# Patient Record
Sex: Female | Born: 1965 | Race: White | Hispanic: No | Marital: Single | Smoking: Never smoker
Health system: Southern US, Community
[De-identification: ages and names within clinical notes are randomized; demographics above are authoritative.]

## PROBLEM LIST (undated history)

## (undated) HISTORY — PX: ABDOMINAL HYSTERECTOMY: SHX81

## (undated) HISTORY — PX: TONSILLECTOMY: SUR1361

## (undated) HISTORY — PX: ELBOW SURGERY: SHX618

---

## 2016-07-05 ENCOUNTER — Encounter (HOSPITAL_COMMUNITY): Payer: Self-pay

## 2016-07-05 ENCOUNTER — Emergency Department (HOSPITAL_COMMUNITY): Payer: BLUE CROSS/BLUE SHIELD

## 2016-07-05 ENCOUNTER — Emergency Department (HOSPITAL_COMMUNITY)
Admission: EM | Admit: 2016-07-05 | Discharge: 2016-07-05 | Disposition: A | Discharge status: Home / Self Care | Payer: BLUE CROSS/BLUE SHIELD | Attending: Emergency Medicine | Admitting: Emergency Medicine

## 2016-07-05 DIAGNOSIS — Y9389 Activity, other specified: Secondary | ICD-10-CM | POA: Insufficient documentation

## 2016-07-05 DIAGNOSIS — Y999 Unspecified external cause status: Secondary | ICD-10-CM | POA: Diagnosis not present

## 2016-07-05 DIAGNOSIS — W540XXA Bitten by dog, initial encounter: Secondary | ICD-10-CM | POA: Insufficient documentation

## 2016-07-05 DIAGNOSIS — Z23 Encounter for immunization: Secondary | ICD-10-CM | POA: Diagnosis not present

## 2016-07-05 DIAGNOSIS — Y9289 Other specified places as the place of occurrence of the external cause: Secondary | ICD-10-CM | POA: Diagnosis not present

## 2016-07-05 DIAGNOSIS — S51851A Open bite of right forearm, initial encounter: Secondary | ICD-10-CM | POA: Diagnosis present

## 2016-07-05 MED ORDER — HYDROCODONE-ACETAMINOPHEN 5-325 MG PO TABS: 1.0000 | ORAL_TABLET | Freq: Four times a day (QID) | ORAL | 0 refills | Status: DC | PRN

## 2016-07-05 MED ORDER — AMOXICILLIN-POT CLAVULANATE 875-125 MG PO TABS
1.0000 | ORAL_TABLET | Freq: Once | ORAL | Status: AC
  Administered 2016-07-05: 1 via ORAL
  Filled 2016-07-05: qty 1

## 2016-07-05 MED ORDER — AMOXICILLIN-POT CLAVULANATE 875-125 MG PO TABS: 1.0000 | ORAL_TABLET | Freq: Two times a day (BID) | ORAL | 0 refills | Status: DC

## 2016-07-05 MED ORDER — TETANUS-DIPHTH-ACELL PERTUSSIS 5-2.5-18.5 LF-MCG/0.5 IM SUSP
0.5000 mL | Freq: Once | INTRAMUSCULAR | Status: AC
  Administered 2016-07-05: 0.5 mL via INTRAMUSCULAR
  Filled 2016-07-05: qty 0.5

## 2016-07-05 MED ORDER — OXYCODONE-ACETAMINOPHEN 5-325 MG PO TABS
1.0000 | ORAL_TABLET | Freq: Once | ORAL | Status: AC
  Administered 2016-07-05: 1 via ORAL
  Filled 2016-07-05: qty 1

## 2016-07-05 NOTE — ED Provider Notes (Signed)
[facility]-EMERGENCY DEPT Provider Note   CSN: [id number] Arrival date & time: 07/05/16  2106  By signing my name below, I, Cynda AcresHailei Fulton, attest that this documentation has been prepared under the direction and in the presence of Fayrene HelperBowie Pankaj Haack.  Electronically Signed: Cynda AcresHailei Fulton, Scribe. 07/05/16. 10:02 PM.  History   Chief Complaint No chief complaint on file.  HPI Comments: Hannah Harris is a 51 y.o. female with no apparent medical history, who presents to the Emergency Department complaining of a sudden-onset bite to the right lower forearm that happened earlier today. Patient states she kicked a dog treat and her spouses dog bit her on the right arm. The dog breed was german shepard. Patient was not intentionally attacked. Unsure whether the dog is up to date on it's rabies shots. Patient has associated pain to the right forearm. Patient describes her pain as sharp with a severity of 10/10. Patient reports pouring hydrogen peroxide on the arm with no improvement. Patient denies any other symptoms. She is not UTD with tetanus.  She is R hand dominant.  Does report of shooting pain down to her 4-5th fingers and up to her elbow when she moves her R wrist.  No numbness.    The history is provided by the patient. No language interpreter was used.    No past medical history on file.  There are no active problems to display for this patient.   No past surgical history on file.  OB History    No data available       Home Medications    Prior to Admission medications   Not on File    Family History No family history on file.  Social History Social History  Substance Use Topics  . Smoking status: Not on file  . Smokeless tobacco: Not on file  . Alcohol use Not on file     Allergies   Patient has no allergy information on record.   Review of Systems Review of Systems  Constitutional: Negative for chills and fever.  Gastrointestinal: Negative for nausea and vomiting.    Skin: Positive for color change.       Bite to the right forearm.     Physical Exam Updated Vital Signs BP 146/88 (BP Location: Left Arm)   Pulse 88   Temp 97.8 F (36.6 C) (Oral)   Resp 20   Wt 110 lb (49.9 kg)   SpO2 98%   Physical Exam  Constitutional: She is oriented to person, place, and time. She appears well-developed.  HENT:  Head: Normocephalic and atraumatic.  Mouth/Throat: Oropharynx is clear and moist.  Eyes: Conjunctivae and EOM are normal. Pupils are equal, round, and reactive to light.  Neck: Normal range of motion. Neck supple.  Pulmonary/Chest: Effort normal.  Musculoskeletal: Normal range of motion. She exhibits tenderness.  R Wrist: able to perform flexion and extension of supination and pronation. Sensation intact through all fingers with normal strength.  Tenderness to wrist at the bite site.  6 small puncture wound noted mostly to the volar aspect.  Neurological: She is alert and oriented to person, place, and time.  Skin: Skin is warm and dry.  Bite mark noted to the volar aspects of the forearm without active bleeding or foreign object. Erythema around the puncture wound area of the right wrist with normal sensation.   Psychiatric: She has a normal mood and affect.  Nursing note and vitals reviewed.    ED Treatments / Results  DIAGNOSTIC STUDIES: Oxygen  Saturation is 98% on RA, normal by my interpretation.    COORDINATION OF CARE: 10:02 PM Discussed treatment plan with pt at bedside and pt agreed to plan, which includes an x-ray.   dLabs (all labs ordered are listed, but only abnormal results are displayed) Labs Reviewed - No data to display  EKG  EKG Interpretation None       Radiology Dg Forearm Right  Result Date: 07/05/2016 CLINICAL DATA:  Dog bite with pain and numbness. EXAM: RIGHT FOREARM - 2 VIEW COMPARISON:  None. FINDINGS: There is no evidence of fracture or other focal bone lesions. Wrist and elbow alignment is maintained.  Edema in the subcutaneous tissues about the distal forearm, no radiopaque foreign body. IMPRESSION: Soft tissue edema about the distal forearm.  No osseous abnormality. Electronically Signed   By: Rubye Oaks M.D.   On: 07/05/2016 22:28    Procedures Procedures (including critical care time)  Medications Ordered in ED Medications  Tdap (BOOSTRIX) injection 0.5 mL (0.5 mLs Intramuscular Given 07/05/16 2229)  oxyCODONE-acetaminophen (PERCOCET/ROXICET) 5-325 MG per tablet 1 tablet (1 tablet Oral Given 07/05/16 2229)  amoxicillin-clavulanate (AUGMENTIN) 875-125 MG per tablet 1 tablet (1 tablet Oral Given 07/05/16 2229)     Initial Impression / Assessment and Plan / ED Course  I have reviewed the triage vital signs and the nursing notes.  Pertinent labs & imaging results that were available during my care of the patient were reviewed by me and considered in my medical decision making (see chart for details).     BP 146/88 (BP Location: Left Arm)   Pulse 88   Temp 97.8 F (36.6 C) (Oral)   Resp 20   Wt 49.9 kg   SpO2 98%    Final Clinical Impressions(s) / ED Diagnoses   Final diagnoses:  Dog bite of right forearm, initial encounter    New Prescriptions New Prescriptions   AMOXICILLIN-CLAVULANATE (AUGMENTIN) 875-125 MG TABLET    Take 1 tablet by mouth 2 (two) times daily. One po bid x 7 days   HYDROCODONE-ACETAMINOPHEN (NORCO/VICODIN) 5-325 MG TABLET    Take 1 tablet by mouth every 6 (six) hours as needed for moderate pain or severe pain.   I personally performed the services described in this documentation, which was scribed in my presence. The recorded information has been reviewed and is accurate.     10:37 PM Pt had a provoked bite to R distal forearm/wrist from her girlfriend's dog.  Although they are not sure of the rabies status, the dog is being monitored that pt refused rabies prophylactic treatment.  Bite wound were irrigated and dressed appropriately.  ACE wrap  applied.  Xray of R forearm without acute changes.  Pt is NVI.  However, due to location of injury, hand referral given.  Will treat with pain medication and augmentin.  tdap update.  Strict return precaution given.  Pt voice understanding and agrees with plan.  Strict return precaution given.   In order to decrease risk of narcotic abuse. Pt's record were checked using the [place] Controlled Substance database.     Fayrene Helper, PA-C 07/05/16 1610    Gwyneth Sprout, MD 07/06/16 [phone]

## 2016-07-05 NOTE — ED Triage Notes (Signed)
Pt reports being bit by her girlfriend's dog this evening. The bite is on her R forearm. Bleeding controlled. She reports pain radiating to her elbow. Pt nor the dog owner are sure about dog's rabies vaccination status. A&Ox4. Ambulatory.

## 2016-07-07 ENCOUNTER — Emergency Department (HOSPITAL_COMMUNITY)
Admission: EM | Admit: 2016-07-07 | Discharge: 2016-07-07 | Disposition: A | Discharge status: Home / Self Care | Payer: BLUE CROSS/BLUE SHIELD | Attending: Emergency Medicine | Admitting: Emergency Medicine

## 2016-07-07 ENCOUNTER — Encounter (HOSPITAL_COMMUNITY): Payer: Self-pay | Admitting: *Deleted

## 2016-07-07 DIAGNOSIS — L03113 Cellulitis of right upper limb: Secondary | ICD-10-CM | POA: Diagnosis not present

## 2016-07-07 LAB — CBC
HEMATOCRIT: 38.6 % (ref 36.0–46.0)
Hemoglobin: 13 g/dL (ref 12.0–15.0)
MCH: 30.9 pg (ref 26.0–34.0)
MCHC: 33.7 g/dL (ref 30.0–36.0)
MCV: 91.7 fL (ref 78.0–100.0)
Platelets: 180 10*3/uL (ref 150–400)
RBC: 4.21 MIL/uL (ref 3.87–5.11)
RDW: 12.8 % (ref 11.5–15.5)
WBC: 7.5 10*3/uL (ref 4.0–10.5)

## 2016-07-07 LAB — BASIC METABOLIC PANEL
ANION GAP: 8 (ref 5–15)
BUN: 12 mg/dL (ref 6–20)
CHLORIDE: 104 mmol/L (ref 101–111)
CO2: 28 mmol/L (ref 22–32)
Calcium: 9.4 mg/dL (ref 8.9–10.3)
Creatinine, Ser: 0.7 mg/dL (ref 0.44–1.00)
GFR calc Af Amer: >60 mL/min (ref >60)
Glucose, Bld: 106 mg/dL — ABNORMAL HIGH (ref 65–99)
POTASSIUM: 3.5 mmol/L (ref 3.5–5.1)
Sodium: 140 mmol/L (ref 135–145)

## 2016-07-07 MED ORDER — SODIUM CHLORIDE 0.9 % IV SOLN
3.0000 g | Freq: Once | INTRAVENOUS | Status: AC
  Administered 2016-07-07: 3 g via INTRAVENOUS
  Filled 2016-07-07: qty 3

## 2016-07-07 MED ORDER — FENTANYL CITRATE (PF) 100 MCG/2ML IJ SOLN
50.0000 ug | Freq: Once | INTRAMUSCULAR | Status: AC
  Administered 2016-07-07: 50 ug via INTRAVENOUS
  Filled 2016-07-07: qty 2

## 2016-07-07 MED ORDER — TRAMADOL HCL 50 MG PO TABS: 50.0000 mg | ORAL_TABLET | Freq: Four times a day (QID) | ORAL | 0 refills | Status: DC | PRN

## 2016-07-07 MED ORDER — ACETAMINOPHEN 325 MG PO TABS
650.0000 mg | ORAL_TABLET | Freq: Once | ORAL | Status: AC
  Administered 2016-07-07: 650 mg via ORAL
  Filled 2016-07-07: qty 2

## 2016-07-07 NOTE — ED Provider Notes (Signed)
[facility]ER[facility] DEPT Provider Note   CSN: [id number] Arrival date & time: 07/07/16  0927     History   Chief Complaint Chief Complaint  Patient presents with  . dog bite follow up    HPI Hannah Harris is a 51 y.o. female.  HPI Patient presents to the emergency room for evaluation of pain in her arm associated with a dog bite.   The patient was bitten on her right forearm on March 1.   It was her family's dog.   Patient was evaluated in the emergency room. She was treated with tetanus and started on antibiotics. She was offered rabies prophylaxis but they do not feel that RABIES and she declined vaccination. Patient states her pain has been worsening. She has persistent pain in her right forearm. She was up all last night because of the persistent pain.  Patient denies any fevers or chills. She denies any purulent drainage.   History reviewed. No pertinent past medical history.  There are no active problems to display for this patient.   Past Surgical History:  Procedure Laterality Date  . ABDOMINAL HYSTERECTOMY    . ELBOW SURGERY    . TONSILLECTOMY      OB History    No data available       Home Medications    Prior to Admission medications   Medication Sig Start Date End Date Taking? Authorizing Provider  amoxicillin-clavulanate (AUGMENTIN) 875-125 MG tablet Take 1 tablet by mouth 2 (two) times daily. One po bid x 7 days 07/05/16   Fayrene HelperBowie Tran, PA-C  HYDROcodone-acetaminophen (NORCO/VICODIN) 5-325 MG tablet Take 1 tablet by mouth every 6 (six) hours as needed for moderate pain or severe pain. 07/05/16   Fayrene HelperBowie Tran, PA-C  traMADol (ULTRAM) 50 MG tablet Take 1 tablet (50 mg total) by mouth every 6 (six) hours as needed. 07/07/16   Linwood DibblesJon Ashdon Gillson, MD    Family History No family history on file.  Social History Social History  Substance Use Topics  . Smoking status: Never Smoker  . Smokeless tobacco: Never Used  . Alcohol use No     Allergies   Aspirin; Bee venom;  Dilaudid [hydromorphone hcl]; Doxycycline; and Sulfa antibiotics   Review of Systems Review of Systems  All other systems reviewed and are negative.    Physical Exam Updated Vital Signs BP 133/84 (BP Location: Left Arm)   Pulse 92   Temp 97.6 F (36.4 C)   Resp 16   Ht 5\' 4"  (1.626 m)   Wt 49.9 kg   SpO2 100%   BMI 18.88 kg/m   Physical Exam  Constitutional: She appears well-developed and well-nourished. No distress.  HENT:  Head: Normocephalic and atraumatic.  Right Ear: External ear normal.  Left Ear: External ear normal.  Eyes: Conjunctivae are normal. Right eye exhibits no discharge. Left eye exhibits no discharge. No scleral icterus.  Neck: Neck supple. No tracheal deviation present.  Cardiovascular: Normal rate.   Pulmonary/Chest: Effort normal. No stridor. No respiratory distress.  Abdominal: She exhibits no distension.  Musculoskeletal: She exhibits edema and tenderness.  Healing bite wounds on the right forearm, no purulent drainage, erythema and tenderness around the wounds, erythema and tenderness involving the right hand, no lymphangitic streaking towards the elbow, no fluctuance  Neurological: She is alert. Cranial nerve deficit: no gross deficits.  Skin: Skin is warm and dry. No rash noted.  Psychiatric: She has a normal mood and affect.  Nursing note and vitals reviewed.  ED Treatments / Results  Labs (all labs ordered are listed, but only abnormal results are displayed) Labs Reviewed  BASIC METABOLIC PANEL - Abnormal; Notable for the following:       Result Value   Glucose, Bld 106 (*)    All other components within normal limits  CBC     Radiology Dg Forearm Right  Result Date: 07/05/2016 CLINICAL DATA:  Dog bite with pain and numbness. EXAM: RIGHT FOREARM - 2 VIEW COMPARISON:  None. FINDINGS: There is no evidence of fracture or other focal bone lesions. Wrist and elbow alignment is maintained. Edema in the subcutaneous tissues about the  distal forearm, no radiopaque foreign body. IMPRESSION: Soft tissue edema about the distal forearm.  No osseous abnormality. Electronically Signed   By: Rubye Oaks M.D.   On: 07/05/2016 22:28    Procedures Procedures (including critical care time)  Medications Ordered in ED Medications  Ampicillin-Sulbactam (UNASYN) 3 g in sodium chloride 0.9 % 100 mL IVPB (0 g Intravenous Stopped 07/07/16 1128)  acetaminophen (TYLENOL) tablet 650 mg (650 mg Oral Given 07/07/16 1019)  fentaNYL (SUBLIMAZE) injection 50 mcg (50 mcg Intravenous Given 07/07/16 1028)     Initial Impression / Assessment and Plan / ED Course  I have reviewed the triage vital signs and the nursing notes.  Pertinent labs & imaging results that were available during my care of the patient were reviewed by me and considered in my medical decision making (see chart for details).  Clinical Course as of Jul 07 1133  Sat Jul 07, 2016  0944 Pt states dilaudid, and aspiring make her get hives.   Will give tylenol po  [JK]    Clinical Course User Index [JK] Linwood Dibbles, MD  1 rx for opiates noted on 3/1 in   Patient's laboratory tests are normal. She does not have a fever here. I do not see any signs of lymphangitic streaking. The patient is allergic to NSAIDs so she cannot take ibuprofen or Naprosyn. I do not think she needs to be hospitalized for cellulitis. I suspect her pain is related to the dog bite and the cellulitis.  She wanted to follow up with primary care doctor next week to make sure the symptoms are improving. Monitor for signs of increasing redness, fevers  Final Clinical Impressions(s) / ED Diagnoses   Final diagnoses:  Cellulitis of right upper extremity    New Prescriptions New Prescriptions   TRAMADOL (ULTRAM) 50 MG TABLET    Take 1 tablet (50 mg total) by mouth every 6 (six) hours as needed.     Linwood Dibbles, MD 07/07/16 1135

## 2016-07-07 NOTE — ED Triage Notes (Signed)
Pt bitten by dog 2 days ago, has redness, increased pain and swelling in area

## 2016-07-07 NOTE — Discharge Instructions (Signed)
Follow-up with a primary care doctor next week to make sure symptoms are improving, return to the emergency room for fevers worsening redness

## 2016-09-07 ENCOUNTER — Emergency Department (HOSPITAL_COMMUNITY)
Admission: EM | Admit: 2016-09-07 | Discharge: 2016-09-07 | Discharge status: Against Medical Advice | Payer: BLUE CROSS/BLUE SHIELD

## 2016-09-24 ENCOUNTER — Encounter (HOSPITAL_COMMUNITY): Payer: Self-pay

## 2016-09-24 ENCOUNTER — Emergency Department (HOSPITAL_COMMUNITY): Payer: BLUE CROSS/BLUE SHIELD

## 2016-09-24 ENCOUNTER — Emergency Department (HOSPITAL_COMMUNITY)
Admission: EM | Admit: 2016-09-24 | Discharge: 2016-09-24 | Disposition: A | Discharge status: Home / Self Care | Payer: BLUE CROSS/BLUE SHIELD | Attending: Emergency Medicine | Admitting: Emergency Medicine

## 2016-09-24 DIAGNOSIS — Y939 Activity, unspecified: Secondary | ICD-10-CM | POA: Diagnosis not present

## 2016-09-24 DIAGNOSIS — Y999 Unspecified external cause status: Secondary | ICD-10-CM | POA: Diagnosis not present

## 2016-09-24 DIAGNOSIS — S60212A Contusion of left wrist, initial encounter: Secondary | ICD-10-CM | POA: Insufficient documentation

## 2016-09-24 DIAGNOSIS — Y92512 Supermarket, store or market as the place of occurrence of the external cause: Secondary | ICD-10-CM | POA: Diagnosis not present

## 2016-09-24 DIAGNOSIS — S6992XA Unspecified injury of left wrist, hand and finger(s), initial encounter: Secondary | ICD-10-CM | POA: Diagnosis present

## 2016-09-24 DIAGNOSIS — W228XXA Striking against or struck by other objects, initial encounter: Secondary | ICD-10-CM | POA: Insufficient documentation

## 2016-09-24 DIAGNOSIS — Z79899 Other long term (current) drug therapy: Secondary | ICD-10-CM | POA: Insufficient documentation

## 2016-09-24 MED ORDER — IBUPROFEN 600 MG PO TABS: 600.0000 mg | ORAL_TABLET | Freq: Four times a day (QID) | ORAL | 0 refills | Status: DC | PRN

## 2016-09-24 MED ORDER — LORAZEPAM 1 MG PO TABS
1.0000 mg | ORAL_TABLET | Freq: Once | ORAL | Status: AC
  Administered 2016-09-24: 1 mg via ORAL
  Filled 2016-09-24: qty 1

## 2016-09-24 MED ORDER — IBUPROFEN 200 MG PO TABS
600.0000 mg | ORAL_TABLET | Freq: Once | ORAL | Status: AC
  Administered 2016-09-24: 600 mg via ORAL
  Filled 2016-09-24: qty 3

## 2016-09-24 NOTE — ED Notes (Signed)
PT DISCHARGED. INSTRUCTIONS AND PRESCRIPTION GIVEN. AAOX4. PT IN NO APPARENT DISTRESS WITH MODERATE PAIN. THE OPPORTUNITY TO ASK QUESTIONS WAS PROVIDED. 
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
## 2016-09-24 NOTE — ED Notes (Signed)
ED Provider at bedside. 

## 2016-09-24 NOTE — Discharge Instructions (Signed)
Take your medication as prescribed. I also recommend resting, elevating and applying ice to her left hand/wrist for 15-20 minutes 3-4 times daily. You may continue wearing your Ace wrap as needed for comfort over the next few days. If your symptoms are not improved over the next week I recommend following up with the orthopedic clinic listed below.
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
## 2016-09-28 ENCOUNTER — Emergency Department (HOSPITAL_COMMUNITY)
Admission: EM | Admit: 2016-09-28 | Discharge: 2016-09-29 | Disposition: A | Discharge status: Home / Self Care | Payer: BLUE CROSS/BLUE SHIELD | Attending: Emergency Medicine | Admitting: Emergency Medicine

## 2016-09-28 ENCOUNTER — Encounter (HOSPITAL_COMMUNITY): Payer: Self-pay

## 2016-09-28 DIAGNOSIS — Y92009 Unspecified place in unspecified non-institutional (private) residence as the place of occurrence of the external cause: Secondary | ICD-10-CM

## 2016-09-28 DIAGNOSIS — Y999 Unspecified external cause status: Secondary | ICD-10-CM | POA: Diagnosis not present

## 2016-09-28 DIAGNOSIS — S0003XA Contusion of scalp, initial encounter: Secondary | ICD-10-CM | POA: Diagnosis not present

## 2016-09-28 DIAGNOSIS — W19XXXA Unspecified fall, initial encounter: Secondary | ICD-10-CM

## 2016-09-28 DIAGNOSIS — S93401A Sprain of unspecified ligament of right ankle, initial encounter: Secondary | ICD-10-CM | POA: Diagnosis not present

## 2016-09-28 DIAGNOSIS — S199XXA Unspecified injury of neck, initial encounter: Secondary | ICD-10-CM | POA: Diagnosis present

## 2016-09-28 DIAGNOSIS — Y939 Activity, unspecified: Secondary | ICD-10-CM | POA: Diagnosis not present

## 2016-09-28 DIAGNOSIS — S5001XA Contusion of right elbow, initial encounter: Secondary | ICD-10-CM | POA: Diagnosis not present

## 2016-09-28 DIAGNOSIS — Y929 Unspecified place or not applicable: Secondary | ICD-10-CM | POA: Diagnosis not present

## 2016-09-28 DIAGNOSIS — S161XXA Strain of muscle, fascia and tendon at neck level, initial encounter: Secondary | ICD-10-CM | POA: Diagnosis not present

## 2016-09-28 DIAGNOSIS — W08XXXA Fall from other furniture, initial encounter: Secondary | ICD-10-CM | POA: Insufficient documentation

## 2016-09-28 MED ORDER — NAPROXEN 500 MG PO TABS
500.0000 mg | ORAL_TABLET | Freq: Once | ORAL | Status: AC
  Administered 2016-09-29: 500 mg via ORAL
  Filled 2016-09-28: qty 1

## 2016-09-28 MED ORDER — HYDROCODONE-ACETAMINOPHEN 5-325 MG PO TABS
1.0000 | ORAL_TABLET | Freq: Once | ORAL | Status: AC
  Administered 2016-09-29: 1 via ORAL
  Filled 2016-09-28: qty 1

## 2016-09-28 NOTE — ED Provider Notes (Signed)
[facility]-EMERGENCY DEPT Provider Note   CSN: [id number] Arrival date & time: 09/28/16  2137  By signing my name below, I, Hannah Harris, attest that this documentation has been prepared under the direction and in the presence of TRW AutomotiveKelly Deysi Soldo, [place]PA-C. Electronically Signed: Teofilo PodMatthew P. Harris, ED Scribe. 09/29/2016. 4:38 AM.   History   Chief Complaint Chief Complaint  Patient presents with  . Fall  . Head Injury    The history is provided by the patient. No language interpreter was used.   HPI Comments:  Hannah Harris is a 51 y.o. female who presents to the Emergency Department s/p fall that occurred earlier today. Pt reports that she fell off of a stool today, landing on her right side, and complains of pain to her right arm, side, and neck. She endorses hitting the back of her head, but denies LOC. Pt has a c-collar in place. No alleviating factors noted.  Denies nausea, vomiting. She does report drinking a few beers prior to her fall.  History reviewed. No pertinent past medical history.  There are no active problems to display for this patient.   Past Surgical History:  Procedure Laterality Date  . ABDOMINAL HYSTERECTOMY    . ELBOW SURGERY    . TONSILLECTOMY      OB History    No data available       Home Medications    Prior to Admission medications   Medication Sig Start Date End Date Taking? Authorizing Provider  amoxicillin-clavulanate (AUGMENTIN) 875-125 MG tablet Take 1 tablet by mouth 2 (two) times daily. One po bid x 7 days Patient not taking: Reported on 09/29/2016 07/05/16   Hannah Harris, [place], PA-C  HYDROcodone-acetaminophen (NORCO/VICODIN) 5-325 MG tablet Take 1 tablet by mouth every 6 (six) hours as needed for moderate pain or severe pain. Patient not taking: Reported on 09/29/2016 07/05/16   Hannah Harris, [place], PA-C  ibuprofen (ADVIL,MOTRIN) 600 MG tablet Take 1 tablet (600 mg total) by mouth every 6 (six) hours as needed. 09/29/16   Hannah Harris, Rc Amison, PA-C  traMADol (ULTRAM)  50 MG tablet Take 1 tablet (50 mg total) by mouth every 6 (six) hours as needed. 09/29/16   Hannah Harris, Hannah Denz, PA-C    Family History History reviewed. No pertinent family history.  Social History Social History  Substance Use Topics  . Smoking status: Never Smoker  . Smokeless tobacco: Never Used  . Alcohol use No     Allergies   Bee venom; Dilaudid [hydromorphone hcl]; Aspirin; Doxycycline; and Sulfa antibiotics   Review of Systems Review of Systems All systems reviewed and are negative for acute change except as noted in the HPI.   Physical Exam Updated Vital Signs BP 100/66 (BP Location: Left Arm)   Pulse 66   Temp 97.5 F (36.4 C) (Oral)   Resp 18   SpO2 94%   Physical Exam  Constitutional: She is oriented to person, place, and time. She appears well-developed and well-nourished. No distress.  Nontoxic and in NAD  HENT:  Head: Normocephalic and atraumatic.  No Battle sign or raccoons eyes. No skull instability or palpable hematoma  Eyes: Conjunctivae and EOM are normal. No scleral icterus.  Neck:  Cervical collar in place  Cardiovascular: Normal rate, normal heart sounds and intact distal pulses.   DP and PT pulses 2+ in the right lower extremity.  Pulmonary/Chest: Effort normal. No respiratory distress. She has no wheezes.  Respirations even and unlabored  Abdominal: She exhibits no distension.  Musculoskeletal: Normal range of  motion.  Contusion noted to the right elbow. Normal range of motion of the right upper extremity. There is also bruising and swelling to the lateral aspect of the right ankle. No deformity noted.  Neurological: She is alert and oriented to person, place, and time. No cranial nerve deficit. She exhibits normal muscle tone. Coordination normal.  GCS 15. No focal neurologic deficits appreciated.  Skin: Skin is warm and dry. No rash noted. She is not diaphoretic. No erythema. No pallor.  Psychiatric: She has a normal mood and affect. Her  behavior is normal.  Nursing note and vitals reviewed.    ED Treatments / Results  DIAGNOSTIC STUDIES:  Oxygen Saturation is 100% on RA, normal by my interpretation.    COORDINATION OF CARE:  11:51 PM CT head and C-spine ordered. Unable to r/o C-spine via NEXUS criteria given hx of ETOH. Will also obtain Xrays. Discussed treatment plan with pt at bedside and pt agreed to plan.   Labs (all labs ordered are listed, but only abnormal results are displayed) Labs Reviewed - No data to display  EKG  EKG Interpretation None       Radiology Dg Elbow Complete Right  Result Date: 09/29/2016 CLINICAL DATA:  Acute onset of right posterior elbow pain and swelling, after fall from stool. Initial encounter. EXAM: RIGHT ELBOW - COMPLETE 3+ VIEW COMPARISON:  None. FINDINGS: There is no evidence of fracture or dislocation. The visualized joint spaces are preserved. No significant joint effusion is identified. The soft tissues are unremarkable in appearance. IMPRESSION: No evidence of fracture or dislocation. Electronically Signed   By: Hannah Harris M.D.   On: 09/29/2016 00:44   Dg Ankle Complete Right  Result Date: 09/29/2016 CLINICAL DATA:  Acute onset of lateral right ankle pain and swelling, after fall from stool. Initial encounter. EXAM: RIGHT ANKLE - COMPLETE 3+ VIEW COMPARISON:  None. FINDINGS: There is no evidence of fracture or dislocation. The ankle mortise is intact; the interosseous space is within normal limits. No talar tilt or subluxation is seen. The joint spaces are preserved. Mild lateral soft tissue swelling is noted. IMPRESSION: No evidence of fracture or dislocation. Electronically Signed   By: Hannah Harris M.D.   On: 09/29/2016 00:44   Ct Head Wo Contrast  Result Date: 09/29/2016 CLINICAL DATA:  Patient fell today, striking the back of the head. Bruising and pain. No loss of consciousness. EXAM: CT HEAD WITHOUT CONTRAST CT CERVICAL SPINE WITHOUT CONTRAST TECHNIQUE:  Multidetector CT imaging of the head and cervical spine was performed following the standard protocol without intravenous contrast. Multiplanar CT image reconstructions of the cervical spine were also generated. COMPARISON:  None. FINDINGS: CT HEAD FINDINGS Brain: No evidence of acute infarction, hemorrhage, hydrocephalus, extra-axial collection or mass lesion/mass effect. Vascular: No hyperdense vessel or unexpected calcification. Skull: Normal. Negative for fracture or focal lesion. Sinuses/Orbits: No acute finding. Other: None. CT CERVICAL SPINE FINDINGS Alignment: Normal. Skull base and vertebrae: No acute fracture. No primary bone lesion or focal pathologic process. Soft tissues and spinal canal: No prevertebral fluid or swelling. No visible canal hematoma. Disc levels: Degenerative changes throughout the cervical spine with narrowed disc spaces and endplate hypertrophic changes. Degenerative changes are most prominent at C4-5, C5-6, and C6-7 levels. Degenerative changes in the posterior facet joints. Upper chest: Lung apices are clear. Other: None. IMPRESSION: No acute intracranial abnormalities. Normal alignment of the cervical spine. Diffuse degenerative changes. No acute displaced fractures identified. Electronically Signed   By: Marisa Cyphers.D.  On: 09/29/2016 00:34   Ct Cervical Spine Wo Contrast  Result Date: 09/29/2016 CLINICAL DATA:  Patient fell today, striking the back of the head. Bruising and pain. No loss of consciousness. EXAM: CT HEAD WITHOUT CONTRAST CT CERVICAL SPINE WITHOUT CONTRAST TECHNIQUE: Multidetector CT imaging of the head and cervical spine was performed following the standard protocol without intravenous contrast. Multiplanar CT image reconstructions of the cervical spine were also generated. COMPARISON:  None. FINDINGS: CT HEAD FINDINGS Brain: No evidence of acute infarction, hemorrhage, hydrocephalus, extra-axial collection or mass lesion/mass effect. Vascular: No  hyperdense vessel or unexpected calcification. Skull: Normal. Negative for fracture or focal lesion. Sinuses/Orbits: No acute finding. Other: None. CT CERVICAL SPINE FINDINGS Alignment: Normal. Skull base and vertebrae: No acute fracture. No primary bone lesion or focal pathologic process. Soft tissues and spinal canal: No prevertebral fluid or swelling. No visible canal hematoma. Disc levels: Degenerative changes throughout the cervical spine with narrowed disc spaces and endplate hypertrophic changes. Degenerative changes are most prominent at C4-5, C5-6, and C6-7 levels. Degenerative changes in the posterior facet joints. Upper chest: Lung apices are clear. Other: None. IMPRESSION: No acute intracranial abnormalities. Normal alignment of the cervical spine. Diffuse degenerative changes. No acute displaced fractures identified. Electronically Signed   By: Burman Nieves M.D.   On: 09/29/2016 00:34    Procedures Procedures (including critical care time)  Medications Ordered in ED Medications  naproxen (NAPROSYN) tablet 500 mg (500 mg Oral Given 09/29/16 0026)  HYDROcodone-acetaminophen (NORCO/VICODIN) 5-325 MG per tablet 1 tablet (1 tablet Oral Given 09/29/16 0026)     Initial Impression / Assessment and Plan / ED Course  I have reviewed the triage vital signs and the nursing notes.  Pertinent labs & imaging results that were available during my care of the patient were reviewed by me and considered in my medical decision making (see chart for details).     Patient presents after a witnessed fall. No loss of consciousness. No evidence of head injury or trauma. Cervical collar applied in triage. Patient denies concussive symptoms including nausea and vomiting. No focal deficits noted on exam.  Imaging obtained, all of which is reassuring. C-spine cleared and collar removed. I have counseled the patient on supportive management. I do not believe further emergent workup is indicated. Patient  discharged in stable condition with no unaddressed concerns.   Final Clinical Impressions(s) / ED Diagnoses   Final diagnoses:  Fall in home, initial encounter  Contusion of right elbow, initial encounter  Sprain of right ankle, unspecified ligament, initial encounter  Neck strain, initial encounter  Contusion of scalp, initial encounter    New Prescriptions Discharge Medication List as of 09/29/2016  1:37 AM      I personally performed the services described in this documentation, which was scribed in my presence. The recorded information has been reviewed and is accurate.       Hannah Madura, PA-C 09/29/16 4540    Devoria Albe, MD 09/29/16 [phone]

## 2016-09-28 NOTE — ED Triage Notes (Signed)
Pt fell from a stool today and states that she is having pain on her R side especially from her elbow up to her neck. She endorses hitting the back of her head. She denies blood thinners or LOC. Pt place in c collar, but was ambulatory to triage. A&Ox4.

## 2016-09-29 ENCOUNTER — Emergency Department (HOSPITAL_COMMUNITY): Payer: BLUE CROSS/BLUE SHIELD

## 2016-09-29 MED ORDER — TRAMADOL HCL 50 MG PO TABS: 50.0000 mg | ORAL_TABLET | Freq: Four times a day (QID) | ORAL | 0 refills | Status: DC | PRN

## 2016-09-29 MED ORDER — IBUPROFEN 600 MG PO TABS: 600.0000 mg | ORAL_TABLET | Freq: Four times a day (QID) | ORAL | 0 refills | Status: DC | PRN

## 2016-09-29 NOTE — ED Notes (Signed)
Pt is in xray dept

## 2016-09-29 NOTE — ED Notes (Signed)
Right ASO ankle bracelet applied. Pt tolerated well and understands home use.

## 2016-11-03 ENCOUNTER — Emergency Department (HOSPITAL_COMMUNITY)
Admission: EM | Admit: 2016-11-03 | Discharge: 2016-11-04 | Discharge status: Against Medical Advice | Payer: BLUE CROSS/BLUE SHIELD

## 2016-11-03 NOTE — ED Notes (Signed)
Pt called for triage with no response. 

## 2016-11-04 NOTE — ED Notes (Signed)
Pt called for triage with no response x2 

## 2016-11-11 ENCOUNTER — Encounter (HOSPITAL_COMMUNITY): Payer: Self-pay | Admitting: Emergency Medicine

## 2016-11-11 ENCOUNTER — Emergency Department (HOSPITAL_COMMUNITY): Payer: BLUE CROSS/BLUE SHIELD

## 2016-11-11 ENCOUNTER — Emergency Department (HOSPITAL_COMMUNITY)
Admission: EM | Admit: 2016-11-11 | Discharge: 2016-11-11 | Disposition: A | Discharge status: Home / Self Care | Payer: BLUE CROSS/BLUE SHIELD | Attending: Emergency Medicine | Admitting: Emergency Medicine

## 2016-11-11 DIAGNOSIS — M545 Low back pain, unspecified: Secondary | ICD-10-CM

## 2016-11-11 MED ORDER — DEXAMETHASONE 4 MG PO TABS: 8.0000 mg | ORAL_TABLET | Freq: Every day | ORAL | 0 refills | Status: DC

## 2016-11-11 MED ORDER — MORPHINE SULFATE (PF) 2 MG/ML IV SOLN
4.0000 mg | Freq: Once | INTRAVENOUS | Status: AC
  Administered 2016-11-11: 4 mg via INTRAMUSCULAR
  Filled 2016-11-11: qty 2

## 2016-11-11 MED ORDER — MELOXICAM 7.5 MG PO TABS: 7.5000 mg | ORAL_TABLET | Freq: Every day | ORAL | 0 refills | Status: DC | PRN

## 2016-11-11 MED ORDER — KETOROLAC TROMETHAMINE 15 MG/ML IJ SOLN
15.0000 mg | Freq: Once | INTRAMUSCULAR | Status: AC
  Administered 2016-11-11: 15 mg via INTRAMUSCULAR
  Filled 2016-11-11: qty 1

## 2016-11-11 MED ORDER — DIAZEPAM 5 MG PO TABS
5.0000 mg | ORAL_TABLET | Freq: Once | ORAL | Status: AC
  Administered 2016-11-11: 5 mg via ORAL
  Filled 2016-11-11: qty 1

## 2016-11-11 MED ORDER — HYDROCODONE-ACETAMINOPHEN 5-325 MG PO TABS: 1.0000 | ORAL_TABLET | ORAL | 0 refills | Status: DC | PRN
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
## 2016-11-11 NOTE — ED Notes (Signed)
ED Provider at bedside. 

## 2016-12-22 ENCOUNTER — Encounter (HOSPITAL_COMMUNITY): Payer: Self-pay

## 2016-12-22 ENCOUNTER — Emergency Department (HOSPITAL_COMMUNITY)
Admission: EM | Admit: 2016-12-22 | Discharge: 2016-12-22 | Disposition: A | Discharge status: Home / Self Care | Payer: BLUE CROSS/BLUE SHIELD | Attending: Emergency Medicine | Admitting: Emergency Medicine

## 2016-12-22 DIAGNOSIS — Z5321 Procedure and treatment not carried out due to patient leaving prior to being seen by health care provider: Secondary | ICD-10-CM | POA: Diagnosis not present

## 2016-12-22 DIAGNOSIS — R197 Diarrhea, unspecified: Secondary | ICD-10-CM | POA: Diagnosis not present

## 2016-12-22 DIAGNOSIS — R1013 Epigastric pain: Secondary | ICD-10-CM | POA: Insufficient documentation

## 2016-12-22 NOTE — ED Triage Notes (Signed)
Pt reports abdominal pain starting last night. She states that the pain starts above her umbilicus and radiates around to her R side. She describes the pain as cramping. Also reports diarrhea starting last night. Denies vomiting, endorses nausea.  A&Ox4. Ambulatory.

## 2016-12-22 NOTE — ED Notes (Signed)
Pt states that she cannot wait any longer. She turned in her stickers and LWBS.

## 2016-12-27 ENCOUNTER — Emergency Department (HOSPITAL_COMMUNITY): Payer: BLUE CROSS/BLUE SHIELD

## 2016-12-27 ENCOUNTER — Emergency Department (HOSPITAL_COMMUNITY)
Admission: EM | Admit: 2016-12-27 | Discharge: 2016-12-28 | Disposition: A | Discharge status: Home / Self Care | Payer: BLUE CROSS/BLUE SHIELD | Attending: Emergency Medicine | Admitting: Emergency Medicine

## 2016-12-27 DIAGNOSIS — R1084 Generalized abdominal pain: Secondary | ICD-10-CM | POA: Insufficient documentation

## 2016-12-27 LAB — URINALYSIS, ROUTINE W REFLEX MICROSCOPIC
BILIRUBIN URINE: NEGATIVE
GLUCOSE, UA: NEGATIVE mg/dL
Hgb urine dipstick: NEGATIVE
Ketones, ur: NEGATIVE mg/dL
Leukocytes, UA: NEGATIVE
NITRITE: NEGATIVE
PH: 6 (ref 5.0–8.0)
Protein, ur: NEGATIVE mg/dL
SPECIFIC GRAVITY, URINE: 1.013 (ref 1.005–1.030)

## 2016-12-27 LAB — COMPREHENSIVE METABOLIC PANEL
ALBUMIN: 4.3 g/dL (ref 3.5–5.0)
ALT: 17 U/L (ref 14–54)
ANION GAP: 6 (ref 5–15)
AST: 23 U/L (ref 15–41)
Alkaline Phosphatase: 61 U/L (ref 38–126)
BILIRUBIN TOTAL: 0.5 mg/dL (ref 0.3–1.2)
BUN: 7 mg/dL (ref 6–20)
CHLORIDE: 104 mmol/L (ref 101–111)
CO2: 31 mmol/L (ref 22–32)
Calcium: 9.5 mg/dL (ref 8.9–10.3)
Creatinine, Ser: 0.64 mg/dL (ref 0.44–1.00)
GFR calc Af Amer: >60 mL/min (ref >60)
GFR calc non Af Amer: >60 mL/min (ref >60)
GLUCOSE: 96 mg/dL (ref 65–99)
POTASSIUM: 3.5 mmol/L (ref 3.5–5.1)
SODIUM: 141 mmol/L (ref 135–145)
TOTAL PROTEIN: 7 g/dL (ref 6.5–8.1)

## 2016-12-27 LAB — CBC
HEMATOCRIT: 35.8 % — AB (ref 36.0–46.0)
HEMOGLOBIN: 12.4 g/dL (ref 12.0–15.0)
MCH: 31.1 pg (ref 26.0–34.0)
MCHC: 34.6 g/dL (ref 30.0–36.0)
MCV: 89.7 fL (ref 78.0–100.0)
Platelets: 213 10*3/uL (ref 150–400)
RBC: 3.99 MIL/uL (ref 3.87–5.11)
RDW: 12.3 % (ref 11.5–15.5)
WBC: 6.3 10*3/uL (ref 4.0–10.5)

## 2016-12-27 LAB — LIPASE, BLOOD: LIPASE: 40 U/L (ref 11–51)

## 2016-12-27 MED ORDER — ONDANSETRON HCL 4 MG/2ML IJ SOLN
4.0000 mg | Freq: Once | INTRAMUSCULAR | Status: AC
  Administered 2016-12-27: 4 mg via INTRAVENOUS
  Filled 2016-12-27: qty 2

## 2016-12-27 MED ORDER — FENTANYL CITRATE (PF) 100 MCG/2ML IJ SOLN
100.0000 ug | INTRAMUSCULAR | Status: AC | PRN
  Administered 2016-12-27 (×2): 100 ug via INTRAVENOUS
  Filled 2016-12-27 (×2): qty 2

## 2016-12-27 MED ORDER — SODIUM CHLORIDE 0.9 % IV BOLUS (SEPSIS)
1000.0000 mL | Freq: Once | INTRAVENOUS | Status: AC
  Administered 2016-12-27: 1000 mL via INTRAVENOUS

## 2016-12-27 MED ORDER — HYDROCODONE-ACETAMINOPHEN 5-325 MG PO TABS: 1.0000 | ORAL_TABLET | ORAL | 0 refills | Status: AC | PRN

## 2016-12-27 NOTE — ED Provider Notes (Signed)
[facility]-EMERGENCY DEPT Provider Note   CSN: [id number] Arrival date & time: 12/27/16  1717     History   Chief Complaint Chief Complaint  Patient presents with  . Abdominal Pain    HPI Hannah Harris is a 51 y.o. female.  She presents for evaluation of pain in the mid abdomen radiating to right upper quadrant and right flank, present for several days, and associated with anorexia.  She feels like she is bloated but is having normal daily bowel movements, without diarrhea, for the last few days.  She did have some diarrhea, followed by constipation about 10 days ago.  Following the constipation she took some Dulcolax, with improvement of the constipation.  There is been no fever, chills, vomiting, diarrhea, weakness or dizziness.  She saw a doctor at an urgent care and was referred for ultrasound but she has not been able to get it yet.  She denies hematuria, dysuria or urgency.  No history of kidney stones.  There are no other known modifying factors.  HPI  No past medical history on file.  There are no active problems to display for this patient.   Past Surgical History:  Procedure Laterality Date  . ABDOMINAL HYSTERECTOMY    . ELBOW SURGERY    . TONSILLECTOMY      OB History    No data available       Home Medications    Prior to Admission medications   Medication Sig Start Date End Date Taking? Authorizing Provider  cetirizine (ZYRTEC) 10 MG tablet Take 10 mg by mouth daily as needed for allergies.   Yes [provider]  HYDROcodone-acetaminophen (NORCO) 5-325 MG tablet Take 1 tablet by mouth every 4 (four) hours as needed. 12/27/16   Mancel Bale, MD    Family History No family history on file.  Social History Social History  Substance Use Topics  . Smoking status: Never Smoker  . Smokeless tobacco: Never Used  . Alcohol use No     Allergies   Bee venom; Dilaudid [hydromorphone hcl]; Aspirin; Augmentin [amoxicillin-pot clavulanate]; Doxycycline;  and Sulfa antibiotics   Review of Systems Review of Systems  All other systems reviewed and are negative.    Physical Exam Updated Vital Signs BP 99/76 (BP Location: Left Arm)   Pulse 89   Temp 98.2 F (36.8 C) (Oral)   Resp 16   SpO2 98%   Physical Exam  Constitutional: She is oriented to person, place, and time. She appears well-developed and well-nourished. No distress.  HENT:  Head: Normocephalic and atraumatic.  Eyes: Pupils are equal, round, and reactive to light. Conjunctivae and EOM are normal.  Neck: Normal range of motion and phonation normal. Neck supple.  Cardiovascular: Normal rate and regular rhythm.   Pulmonary/Chest: Effort normal and breath sounds normal. She exhibits no tenderness.  Abdominal: Soft. She exhibits no distension. There is tenderness (Mild mid and right upper quadrant, tenderness.). There is no rebound and no guarding. No hernia.  Musculoskeletal: Normal range of motion.  Neurological: She is alert and oriented to person, place, and time. She exhibits normal muscle tone.  Skin: Skin is warm and dry.  Psychiatric: She has a normal mood and affect. Her behavior is normal. Judgment and thought content normal.  Nursing note and vitals reviewed.    ED Treatments / Results  Labs (all labs ordered are listed, but only abnormal results are displayed) Labs Reviewed  CBC - Abnormal; Notable for the following:  Result Value   HCT 35.8 (*)    All other components within normal limits  LIPASE, BLOOD  COMPREHENSIVE METABOLIC PANEL  URINALYSIS, ROUTINE W REFLEX MICROSCOPIC    EKG  EKG Interpretation None       Radiology US Abdomen Complete  Result Date: 12/27/2016 CLINICAL DATA:  Right upper quadrant pain.  Nausea and vomiting. EXAM: ABDOMEN ULTRASOUND COMPLETE COMPARISON:  None. FINDINGS: Gallbladder: Physiologically distended. No gallstones or wall thickening visualized. No sonographic Murphy sign noted by sonographer. Common bile duct:  Diameter: 4 mm, normal. Liver: No focal lesion identified. Within normal limits in parenchymal echogenicity. Portal vein is patent on color Doppler imaging with normal direction of blood flow towards the liver. IVC: No abnormality visualized. Pancreas: Visualized portion unremarkable. Spleen: Size and appearance within normal limits. Right Kidney: Length: 9.2 cm. Echogenicity within normal limits. No mass or hydronephrosis visualized. Left Kidney: Length: 9.7 cm. Echogenicity within normal limits. No mass or hydronephrosis visualized. Abdominal aorta: No aneurysm visualized. Other findings: No ascites. IMPRESSION: Normal abdominal ultrasound. Electronically Signed   By: Rubye Oaks M.D.   On: 12/27/2016 22:47    Procedures Procedures (including critical care time)  Medications Ordered in ED Medications  sodium chloride 0.9 % bolus 1,000 mL (0 mLs Intravenous Stopped 12/27/16 2256)  fentaNYL (SUBLIMAZE) injection 100 mcg (100 mcg Intravenous Given 12/27/16 2325)  ondansetron (ZOFRAN) injection 4 mg (4 mg Intravenous Given 12/27/16 2157)     Initial Impression / Assessment and Plan / ED Course  I have reviewed the triage vital signs and the nursing notes.  Pertinent labs & imaging results that were available during my care of the patient were reviewed by me and considered in my medical decision making (see chart for details).      Patient Vitals for the past 24 hrs:  BP Temp Temp src Pulse Resp SpO2  12/27/16 2240 99/76 - - 89 16 98 %  12/27/16 1956 122/89 - - 75 18 100 %  12/27/16 1743 114/71 98.2 F (36.8 C) Oral 90 18 100 %    12:02 AM Reevaluation with update and discussion. After initial assessment and treatment, an updated evaluation reveals she remains comfortable and has no further complaints.  Findings discussed with the patient and all questions were answered. Hendrix Yurkovich L     Final Clinical Impressions(s) / ED Diagnoses   Final diagnoses:  Generalized abdominal pain     Nonspecific abdominal pain, with reassuring workup.  Patient has some bowel irregularities which may explain her discomfort.  Doubt serious bacterial infection, metabolic instability or impending vascular collapse.  Nursing Notes Reviewed/ Care Coordinated Applicable Imaging Reviewed Interpretation of Laboratory Data incorporated into ED treatment  The patient appears reasonably screened and/or stabilized for discharge and I doubt any other medical condition or other [facility] requiring further screening, evaluation, or treatment in the ED at this time prior to discharge.  Plan: Home Medications-OTC analgesia.; Home Treatments-increase fluid and fiber in dietary intake; return here if the recommended treatment, does not improve the symptoms; Recommended follow up-PCP and GI follow-up 1 or 2 weeks.   New Prescriptions New Prescriptions   HYDROCODONE-ACETAMINOPHEN (NORCO) 5-325 MG TABLET    Take 1 tablet by mouth every 4 (four) hours as needed.     Mancel Bale, MD 12/28/16 0005

## 2016-12-27 NOTE — ED Triage Notes (Signed)
Pt states that she has had abdominal pain x 4 days. Endorses N/V with bloating. Alert and oriented.

## 2016-12-27 NOTE — ED Notes (Signed)
Pt states she is unable to void at this time.

## 2016-12-28 NOTE — Discharge Instructions (Signed)
Use a stool softener, such as MiraLAX, twice a day for 2 weeks.  Avoid using laxatives, such as Dulcolax.  Try to improve your dietary fiber intake.  Do not drive or work when taking the pain reliever.  Try to minimize use of hydrocodone because it can make constipation worse.  Follow-up with your primary care doctor, and the GI doctor for further evaluation and treatment, within the next few weeks.  Return here, if needed, for problems.

## 2017-01-02 ENCOUNTER — Encounter (HOSPITAL_COMMUNITY): Payer: Self-pay

## 2017-01-02 ENCOUNTER — Emergency Department (HOSPITAL_COMMUNITY)
Admission: EM | Admit: 2017-01-02 | Discharge: 2017-01-02 | Disposition: A | Discharge status: Home / Self Care | Payer: BLUE CROSS/BLUE SHIELD | Attending: Emergency Medicine | Admitting: Emergency Medicine

## 2017-01-02 DIAGNOSIS — Z5321 Procedure and treatment not carried out due to patient leaving prior to being seen by health care provider: Secondary | ICD-10-CM | POA: Insufficient documentation

## 2017-01-02 DIAGNOSIS — R1013 Epigastric pain: Secondary | ICD-10-CM | POA: Diagnosis present

## 2017-01-02 LAB — URINALYSIS, ROUTINE W REFLEX MICROSCOPIC
Bacteria, UA: NONE SEEN
Bilirubin Urine: NEGATIVE
GLUCOSE, UA: NEGATIVE mg/dL
Hgb urine dipstick: NEGATIVE
KETONES UR: NEGATIVE mg/dL
Nitrite: NEGATIVE
PH: 5 (ref 5.0–8.0)
Protein, ur: NEGATIVE mg/dL
SPECIFIC GRAVITY, URINE: 1.024 (ref 1.005–1.030)

## 2017-01-02 LAB — CBC
HCT: 37.4 % (ref 36.0–46.0)
Hemoglobin: 13.2 g/dL (ref 12.0–15.0)
MCH: 31.2 pg (ref 26.0–34.0)
MCHC: 35.3 g/dL (ref 30.0–36.0)
MCV: 88.4 fL (ref 78.0–100.0)
PLATELETS: 197 10*3/uL (ref 150–400)
RBC: 4.23 MIL/uL (ref 3.87–5.11)
RDW: 12.2 % (ref 11.5–15.5)
WBC: 5.6 10*3/uL (ref 4.0–10.5)

## 2017-01-02 LAB — COMPREHENSIVE METABOLIC PANEL
ALK PHOS: 63 U/L (ref 38–126)
ALT: 16 U/L (ref 14–54)
AST: 26 U/L (ref 15–41)
Albumin: 4.3 g/dL (ref 3.5–5.0)
Anion gap: 8 (ref 5–15)
BUN: 11 mg/dL (ref 6–20)
CALCIUM: 9.5 mg/dL (ref 8.9–10.3)
CO2: 26 mmol/L (ref 22–32)
CREATININE: 0.8 mg/dL (ref 0.44–1.00)
Chloride: 105 mmol/L (ref 101–111)
Glucose, Bld: 95 mg/dL (ref 65–99)
Potassium: 3.5 mmol/L (ref 3.5–5.1)
Sodium: 139 mmol/L (ref 135–145)
Total Bilirubin: 0.5 mg/dL (ref 0.3–1.2)
Total Protein: 7.3 g/dL (ref 6.5–8.1)

## 2017-01-02 LAB — LIPASE, BLOOD: Lipase: 37 U/L (ref 11–51)

## 2017-01-02 LAB — POC URINE PREG, ED: Preg Test, Ur: NEGATIVE

## 2017-01-02 NOTE — ED Triage Notes (Signed)
States seen here several time for same problem supposed to be scheduled for GI appointment for procedure.  Now states epigastric pain like in past with nausea voiced.

## 2017-01-02 NOTE — ED Notes (Signed)
Pt left after being advised of wait time

## 2017-01-16 ENCOUNTER — Other Ambulatory Visit: Payer: Self-pay | Admitting: Gastroenterology

## 2017-01-16 DIAGNOSIS — R1011 Right upper quadrant pain: Secondary | ICD-10-CM

## 2017-02-01 ENCOUNTER — Encounter (HOSPITAL_COMMUNITY)
Admission: RE | Admit: 2017-02-01 | Discharge: 2017-02-01 | Disposition: A | Discharge status: Home / Self Care | Payer: BLUE CROSS/BLUE SHIELD | Source: Ambulatory Visit | Attending: Gastroenterology | Admitting: Gastroenterology

## 2017-02-01 DIAGNOSIS — R1011 Right upper quadrant pain: Secondary | ICD-10-CM | POA: Insufficient documentation

## 2017-02-01 MED ORDER — TECHNETIUM TC 99M MEBROFENIN IV KIT
5.1000 | PACK | Freq: Once | INTRAVENOUS | Status: AC | PRN
  Administered 2017-02-01: 5.1 via INTRAVENOUS

## 2018-09-02 IMAGING — CT CT HEAD W/O CM
3 of 8 series · 14 of 47 positions shown, 17 images · non-contrast
Comparison: None.

CLINICAL DATA: Patient fell today, striking the back of the head.
Bruising and pain. No loss of consciousness.

EXAM:
CT HEAD WITHOUT CONTRAST
CT CERVICAL SPINE WITHOUT CONTRAST
TECHNIQUE: Multidetector CT imaging of the head and cervical spine was
performed following the standard protocol without intravenous
contrast. Multiplanar CT image reconstructions of the cervical spine
were also generated.

[Series 9: coronal · coronal · 0.28mm/px · 3 of 59 slices shown]
[im 22/59  brain]
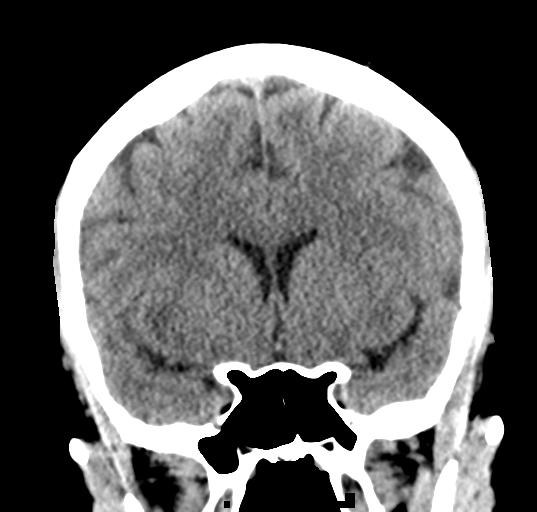
[im 30/59  brain]
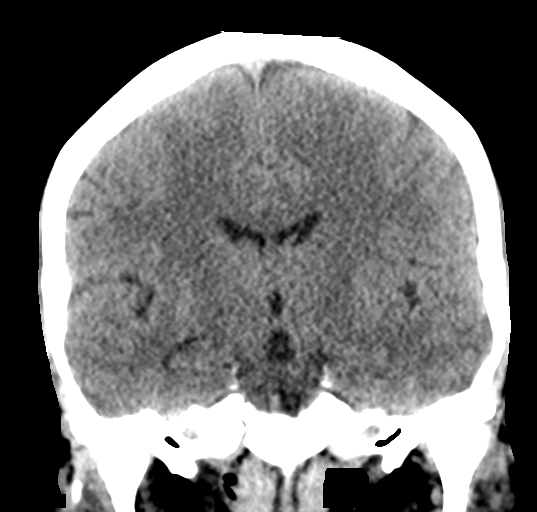
[im 37/59  brain]
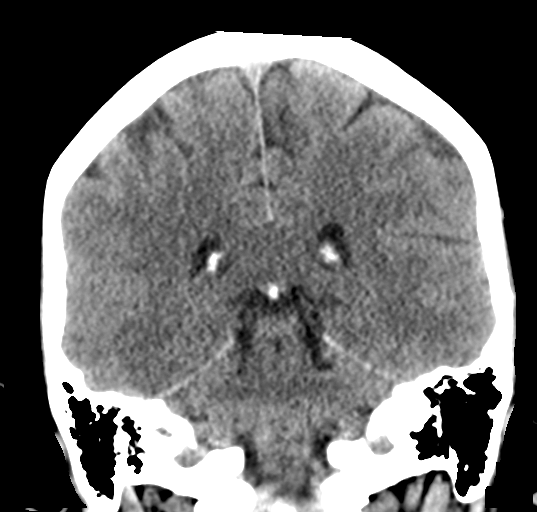

[Series 10: sagittal · sagittal · 0.30mm/px · 2 of 46 slices shown]
[im 16/46  brain]
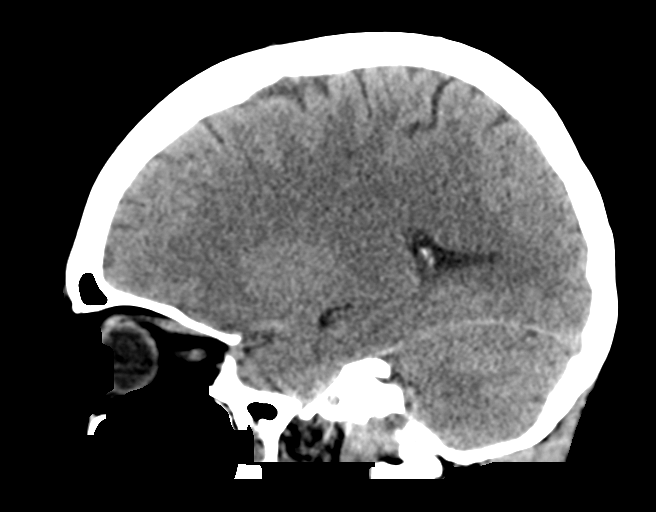
[im 31/46  brain]
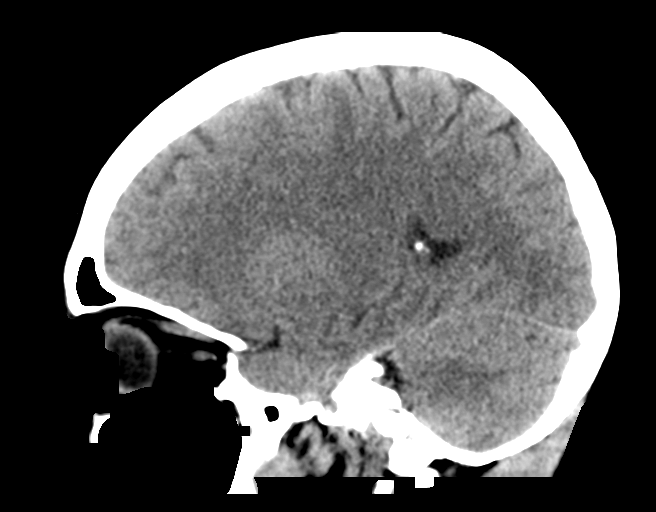

[Series 11: axial recon · axial · 0.23mm/px · z∈[-300,-177]mm · 9 of 94 slices shown, 12 images]
[im 10/94  brain]
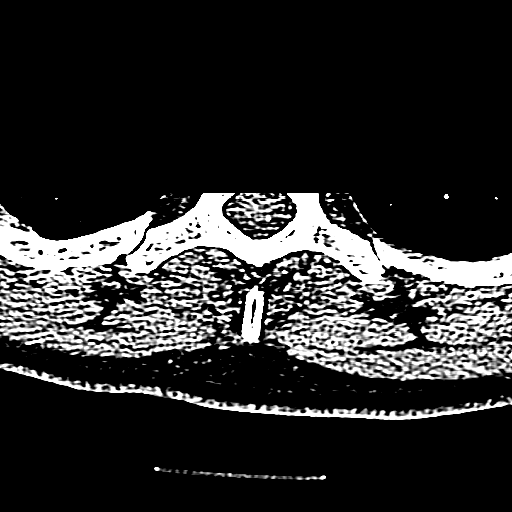
[im 10/94  bone]
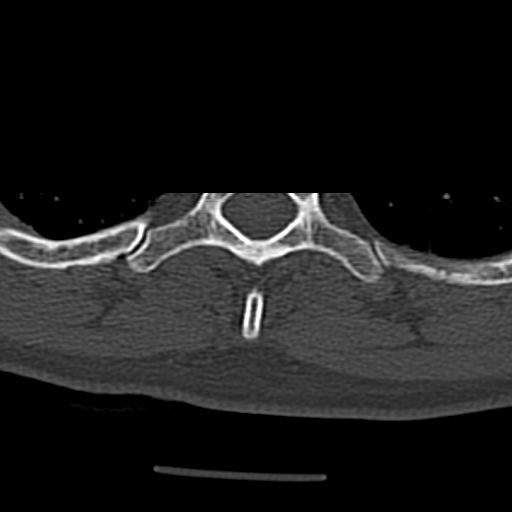
[im 19/94  brain]
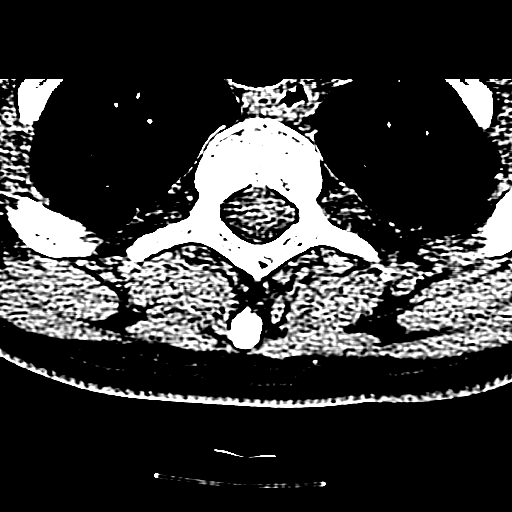
[im 28/94  brain]
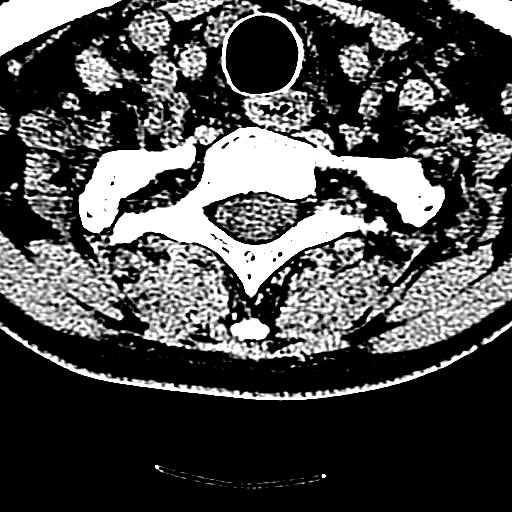
[im 38/94  brain]
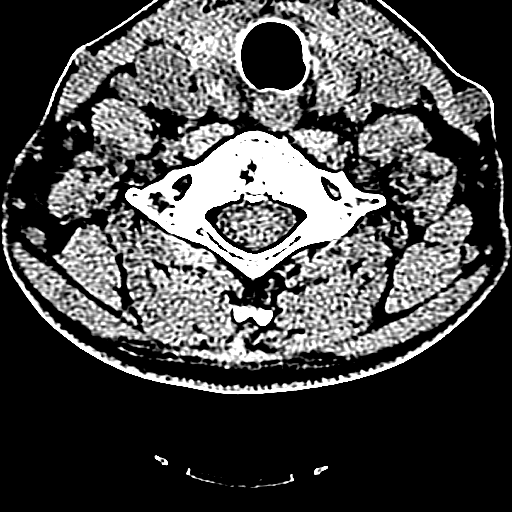
[im 47/94  brain]
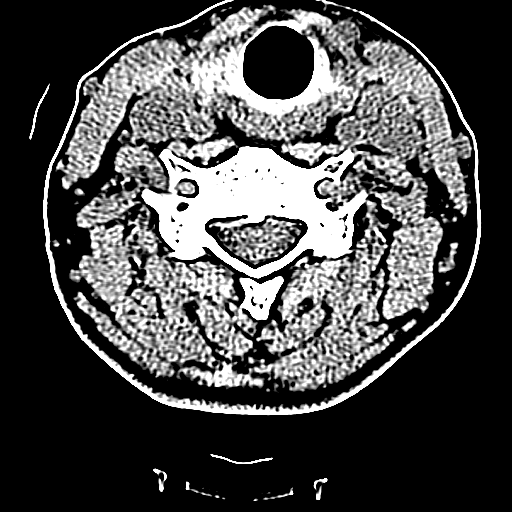
[im 47/94  bone]
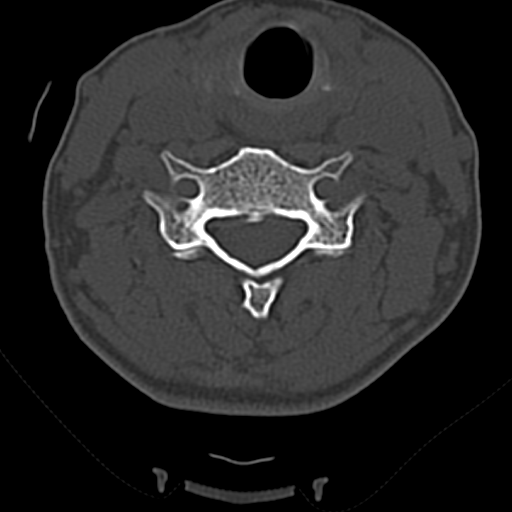
[im 56/94  brain]
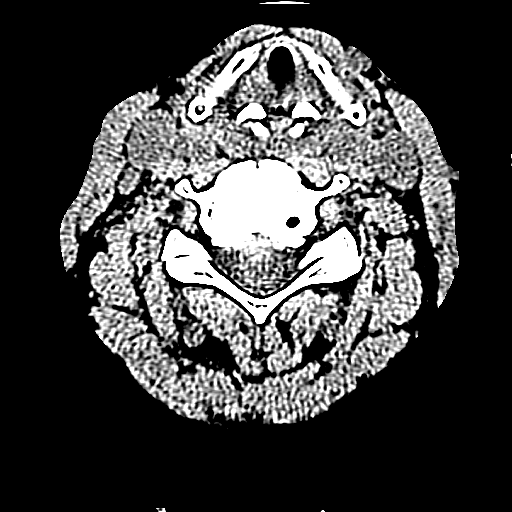
[im 66/94  brain]
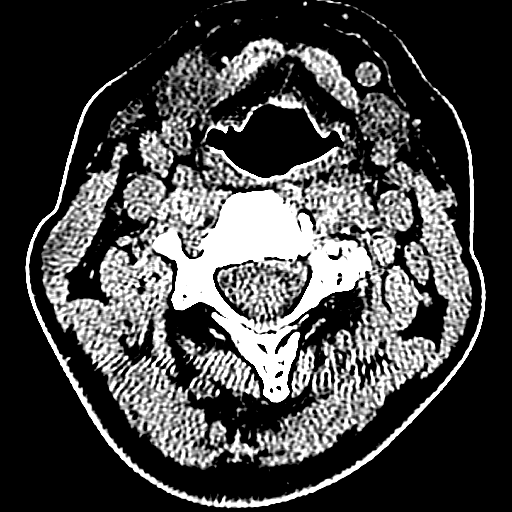
[im 75/94  brain]
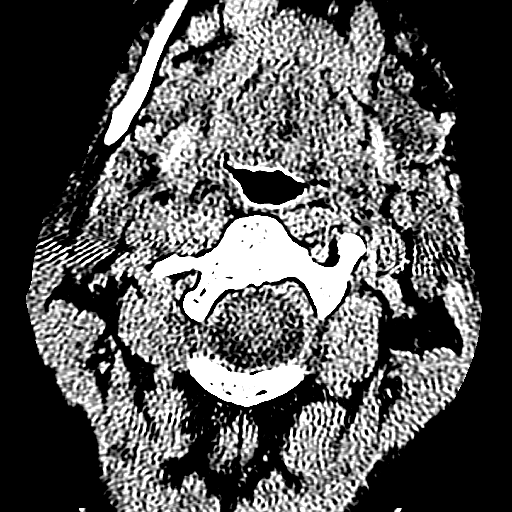
[im 84/94  brain]
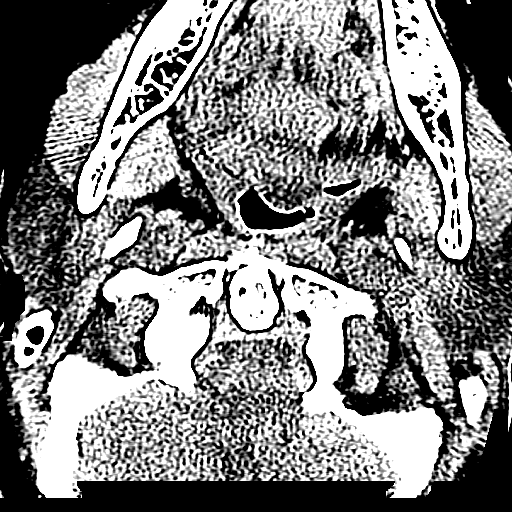
[im 84/94  bone]
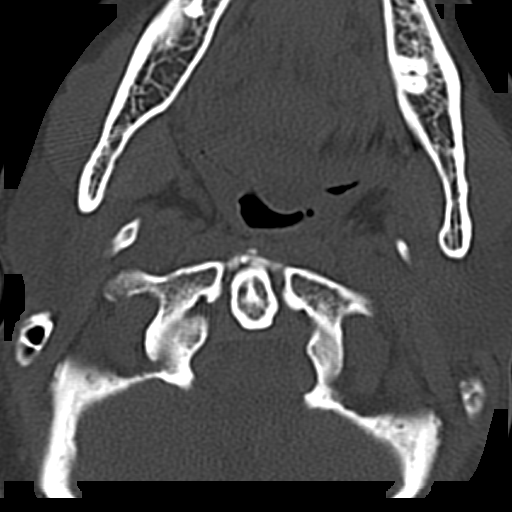

[14 of 47 positions shown; findings below may reference images not displayed]

FINDINGS: CT HEAD FINDINGS

Brain: No evidence of acute infarction, hemorrhage, hydrocephalus,
extra-axial collection or mass lesion/mass effect.

Vascular: No hyperdense vessel or unexpected calcification.

Skull: Normal. Negative for fracture or focal lesion.

Sinuses/Orbits: No acute finding.

Other: None.

CT CERVICAL SPINE FINDINGS

Alignment: Normal.

Skull base and vertebrae: No acute fracture. No primary bone lesion
or focal pathologic process.

Soft tissues and spinal canal: No prevertebral fluid or swelling. No
visible canal hematoma.

Disc levels: Degenerative changes throughout the cervical spine with
narrowed disc spaces and endplate hypertrophic changes. Degenerative
changes are most prominent at C4-5, C5-6, and C6-7 levels.
Degenerative changes in the posterior facet joints.

Upper chest: Lung apices are clear.

Other: None.
IMPRESSION: No acute intracranial abnormalities.

Normal alignment of the cervical spine. Diffuse degenerative
changes. No acute displaced fractures identified.
